# Patient Record
Sex: Male | Born: 1935 | Race: White | Hispanic: No | Marital: Married | Smoking: Former smoker
Health system: Southern US, Community
[De-identification: ages and names within clinical notes are randomized; demographics above are authoritative.]

## PROBLEM LIST (undated history)

## (undated) DIAGNOSIS — I10 Essential (primary) hypertension: Secondary | ICD-10-CM

## (undated) DIAGNOSIS — M1990 Unspecified osteoarthritis, unspecified site: Secondary | ICD-10-CM

## (undated) DIAGNOSIS — E785 Hyperlipidemia, unspecified: Secondary | ICD-10-CM

## (undated) DIAGNOSIS — K859 Acute pancreatitis without necrosis or infection, unspecified: Secondary | ICD-10-CM

## (undated) HISTORY — PX: CHOLECYSTECTOMY: SHX55

---

## 1999-06-02 ENCOUNTER — Inpatient Hospital Stay (HOSPITAL_COMMUNITY): Admission: RE | Admit: 1999-06-02 | Discharge: 1999-06-04 | Payer: Self-pay | Admitting: Urology

## 2001-01-23 HISTORY — PX: OTHER SURGICAL HISTORY: SHX169

## 2011-08-28 ENCOUNTER — Encounter (HOSPITAL_COMMUNITY): Payer: Self-pay | Admitting: *Deleted

## 2011-08-28 NOTE — Progress Notes (Signed)
I called and spoke with Gwyn at Dr [facility] office and requested orders.

## 2011-08-28 NOTE — H&P (Signed)
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 Pt has done very well since. Several months ago he presented to [facility] due to groin pain. Workup showed some arthritis but a retained TFN in the R hip. Pt was referred to OTS for HW removal to see if this is is source of pain or if he indeed has symptomatic R hip arthritis.  Pt with no real complaints today No numbness or tingling No CP, No SOB   Past Medical History  Diagnosis Date  . Diabetes mellitus   . Hyperlipemia   . Hypertension   . Pancreatitis     due to gall stone  . Arthritis     Past Surgical History  Procedure Date  . Right leg fracture 2003    repair with rod-  [facility]  . Cholecystectomy     Family History  Problem Relation Age of Onset  . Diabetes     Social History:  reports that he quit smoking about 27 years ago. He does not have any smokeless tobacco history on file. He reports that he does not drink alcohol or use illicit drugs.  Allergies: No Known Allergies  No prescriptions prior to admission    No results found for this or any previous visit (from the past 48 hour(s)). No results found.  Review of Systems  Musculoskeletal: Positive for joint pain.       R hip pain  All other systems reviewed and are negative.    Height 5\' 8"  (1.727 m), weight 77.111 kg (170 lb). Physical Exam  Constitutional: Vital signs are normal. He appears well-developed and well-nourished. He is cooperative.  HENT:  Head: Normocephalic and atraumatic.  Mouth/Throat: He has dentures.  Eyes: EOM are normal.  Neck: Normal range of motion. Neck supple. No spinous process tenderness and no muscular tenderness present. Normal range of motion present.  Cardiovascular: Normal rate, regular rhythm, S1 normal and S2 normal.   Pulses:      Dorsalis pedis pulses are 2+ on the  right side, and 2+ on the left side.  Respiratory: Effort normal. No respiratory distress. He has no wheezes. He has no rhonchi. He has no rales.  GI: Soft. Normal appearance. There is no tenderness.       + bowel sounds  Musculoskeletal:       Right Lower Extremity   Old wounds stable     Distal motor and sensory functions intact   + groin pain with axial load   Limited IR and ER, pain with these   Good hip flexion    Knee ROM is excellent   Ext is warm   + DP pulse    No swelling of significance     Neurological: He is alert.  Psychiatric: He has a normal mood and affect. His speech is normal and behavior is normal. Judgment and thought content normal. Cognition and memory are normal.     Xrays reviewed in office.   Assessment/Plan  78 y/o male with retained HW R hip  1. Retained HW R hip due to previous fx  OR for removal and allografting of defect  No restrictions after removal  OVN stay, d/c in am  2. Medical issues  Monitor  Home meds 3. DVT/PE prophylaxis  No pharmacologic anticoagulation anticipated 4. Dispo  OR  Mearl Latin, PA-C [facility] [phone] (P) 08/28/2011, 3:04 PM

## 2011-08-28 NOTE — Progress Notes (Signed)
PCP is Dr Windle Guard , pt said that he had labs drawn there  1 week ago.  I requested labs from Dr Shelah Lewandowsky. Pt states he has never had a 2D echo or stress test.

## 2011-08-29 ENCOUNTER — Ambulatory Visit (HOSPITAL_COMMUNITY): Payer: Medicare Other

## 2011-08-29 ENCOUNTER — Ambulatory Visit (HOSPITAL_COMMUNITY): Payer: Medicare Other | Admitting: Anesthesiology

## 2011-08-29 ENCOUNTER — Encounter (HOSPITAL_COMMUNITY): Payer: Self-pay | Admitting: Anesthesiology

## 2011-08-29 ENCOUNTER — Encounter (HOSPITAL_COMMUNITY): Payer: Self-pay | Admitting: *Deleted

## 2011-08-29 ENCOUNTER — Encounter (HOSPITAL_COMMUNITY)
Admission: RE | Disposition: A | Discharge status: Home / Self Care | Payer: Self-pay | Source: Ambulatory Visit | Attending: Orthopedic Surgery

## 2011-08-29 ENCOUNTER — Ambulatory Visit (HOSPITAL_COMMUNITY)
Admission: RE | Admit: 2011-08-29 | Discharge: 2011-08-30 | Disposition: A | Discharge status: Home / Self Care | Payer: Medicare Other | Source: Ambulatory Visit | Attending: Orthopedic Surgery | Admitting: Orthopedic Surgery

## 2011-08-29 DIAGNOSIS — I10 Essential (primary) hypertension: Secondary | ICD-10-CM

## 2011-08-29 DIAGNOSIS — M129 Arthropathy, unspecified: Secondary | ICD-10-CM | POA: Insufficient documentation

## 2011-08-29 DIAGNOSIS — E119 Type 2 diabetes mellitus without complications: Secondary | ICD-10-CM

## 2011-08-29 DIAGNOSIS — T85848A Pain due to other internal prosthetic devices, implants and grafts, initial encounter: Secondary | ICD-10-CM

## 2011-08-29 DIAGNOSIS — E785 Hyperlipidemia, unspecified: Secondary | ICD-10-CM

## 2011-08-29 DIAGNOSIS — T84498A Other mechanical complication of other internal orthopedic devices, implants and grafts, initial encounter: Secondary | ICD-10-CM | POA: Insufficient documentation

## 2011-08-29 DIAGNOSIS — Y834 Other reconstructive surgery as the cause of abnormal reaction of the patient, or of later complication, without mention of misadventure at the time of the procedure: Secondary | ICD-10-CM | POA: Insufficient documentation

## 2011-08-29 HISTORY — DX: Acute pancreatitis without necrosis or infection, unspecified: K85.90

## 2011-08-29 HISTORY — DX: Unspecified osteoarthritis, unspecified site: M19.90

## 2011-08-29 HISTORY — DX: Essential (primary) hypertension: I10

## 2011-08-29 HISTORY — DX: Hyperlipidemia, unspecified: E78.5

## 2011-08-29 HISTORY — PX: HARDWARE REMOVAL: SHX979

## 2011-08-29 LAB — COMPREHENSIVE METABOLIC PANEL
AST: 32 U/L (ref 0–37)
Albumin: 3.8 g/dL (ref 3.5–5.2)
BUN: 18 mg/dL (ref 6–23)
Calcium: 9.3 mg/dL (ref 8.4–10.5)
Creatinine, Ser: 0.83 mg/dL (ref 0.50–1.35)
GFR calc non Af Amer: 80 mL/min — ABNORMAL LOW (ref >90)

## 2011-08-29 LAB — PROTIME-INR
INR: 0.93 (ref 0.00–1.49)
Prothrombin Time: 12.7 seconds (ref 11.6–15.2)

## 2011-08-29 LAB — CBC
HCT: 45 % (ref 39.0–52.0)
MCH: 30.1 pg (ref 26.0–34.0)
MCV: 90.2 fL (ref 78.0–100.0)
Platelets: 244 10*3/uL (ref 150–400)
RDW: 13 % (ref 11.5–15.5)

## 2011-08-29 LAB — APTT: aPTT: 29 seconds (ref 24–37)

## 2011-08-29 LAB — GLUCOSE, CAPILLARY
Glucose-Capillary: 166 mg/dL — ABNORMAL HIGH (ref 70–99)
Glucose-Capillary: 152 mg/dL — ABNORMAL HIGH (ref 70–99)
Glucose-Capillary: 257 mg/dL — ABNORMAL HIGH (ref 70–99)

## 2011-08-29 LAB — SURGICAL PCR SCREEN: Staphylococcus aureus: NEGATIVE

## 2011-08-29 SURGERY — REMOVAL, HARDWARE
Anesthesia: General | Site: Hip | Laterality: Right | Wound class: Clean

## 2011-08-29 MED ORDER — ASPIRIN BUFFERED 325 MG PO TABS: 325.0000 mg | ORAL_TABLET | Freq: Two times a day (BID) | ORAL | Status: DC

## 2011-08-29 MED ORDER — HYDROMORPHONE HCL PF 1 MG/ML IJ SOLN
INTRAMUSCULAR | Status: AC
  Filled 2011-08-29: qty 1

## 2011-08-29 MED ORDER — HYDROMORPHONE HCL PF 1 MG/ML IJ SOLN
0.2500 mg | INTRAMUSCULAR | Status: DC | PRN
  Administered 2011-08-29 (×2): 0.5 mg via INTRAVENOUS

## 2011-08-29 MED ORDER — METHOCARBAMOL 100 MG/ML IJ SOLN
500.0000 mg | Freq: Four times a day (QID) | INTRAVENOUS | Status: DC | PRN
  Filled 2011-08-29: qty 5

## 2011-08-29 MED ORDER — METOCLOPRAMIDE HCL 5 MG/ML IJ SOLN: 5.0000 mg | Freq: Three times a day (TID) | INTRAMUSCULAR | Status: DC | PRN

## 2011-08-29 MED ORDER — 0.9 % SODIUM CHLORIDE (POUR BTL) OPTIME
TOPICAL | Status: DC | PRN
  Administered 2011-08-29: 1000 mL

## 2011-08-29 MED ORDER — EPHEDRINE SULFATE 50 MG/ML IJ SOLN
INTRAMUSCULAR | Status: DC | PRN
  Administered 2011-08-29 (×4): 10 mg via INTRAVENOUS

## 2011-08-29 MED ORDER — LACTATED RINGERS IV SOLN
INTRAVENOUS | Status: DC | PRN
  Administered 2011-08-29 (×2): via INTRAVENOUS

## 2011-08-29 MED ORDER — ONDANSETRON HCL 4 MG PO TABS: 4.0000 mg | ORAL_TABLET | Freq: Four times a day (QID) | ORAL | Status: DC | PRN

## 2011-08-29 MED ORDER — CHLORHEXIDINE GLUCONATE 4 % EX LIQD: 60.0000 mL | Freq: Once | CUTANEOUS | Status: DC

## 2011-08-29 MED ORDER — METOCLOPRAMIDE HCL 10 MG PO TABS: 5.0000 mg | ORAL_TABLET | Freq: Three times a day (TID) | ORAL | Status: DC | PRN

## 2011-08-29 MED ORDER — ONDANSETRON HCL 4 MG/2ML IJ SOLN: 4.0000 mg | Freq: Four times a day (QID) | INTRAMUSCULAR | Status: DC | PRN

## 2011-08-29 MED ORDER — KETOROLAC TROMETHAMINE 30 MG/ML IJ SOLN
30.0000 mg | Freq: Four times a day (QID) | INTRAMUSCULAR | Status: DC
  Administered 2011-08-29 – 2011-08-30 (×3): 30 mg via INTRAVENOUS
  Filled 2011-08-29 (×7): qty 1

## 2011-08-29 MED ORDER — METHOCARBAMOL 500 MG PO TABS: 500.0000 mg | ORAL_TABLET | Freq: Four times a day (QID) | ORAL | Status: DC | PRN

## 2011-08-29 MED ORDER — CEFAZOLIN SODIUM-DEXTROSE 2-3 GM-% IV SOLR
INTRAVENOUS | Status: AC
  Filled 2011-08-29: qty 50

## 2011-08-29 MED ORDER — DOCUSATE SODIUM 100 MG PO CAPS
100.0000 mg | ORAL_CAPSULE | Freq: Two times a day (BID) | ORAL | Status: DC
  Administered 2011-08-29: 100 mg via ORAL
  Filled 2011-08-29 (×3): qty 1

## 2011-08-29 MED ORDER — INSULIN ASPART 100 UNIT/ML ~~LOC~~ SOLN
0.0000 [IU] | Freq: Three times a day (TID) | SUBCUTANEOUS | Status: DC
  Administered 2011-08-30: 2 [IU] via SUBCUTANEOUS

## 2011-08-29 MED ORDER — MUPIROCIN 2 % EX OINT
TOPICAL_OINTMENT | CUTANEOUS | Status: AC
  Administered 2011-08-29: 1 via NASAL
  Filled 2011-08-29: qty 22

## 2011-08-29 MED ORDER — ACETAMINOPHEN 10 MG/ML IV SOLN
1000.0000 mg | Freq: Four times a day (QID) | INTRAVENOUS | Status: DC
  Administered 2011-08-29 – 2011-08-30 (×3): 1000 mg via INTRAVENOUS
  Filled 2011-08-29 (×4): qty 100

## 2011-08-29 MED ORDER — POTASSIUM CHLORIDE IN NACL 20-0.9 MEQ/L-% IV SOLN
INTRAVENOUS | Status: DC
  Filled 2011-08-29 (×2): qty 1000

## 2011-08-29 MED ORDER — CEFAZOLIN SODIUM-DEXTROSE 2-3 GM-% IV SOLR
2.0000 g | INTRAVENOUS | Status: AC
  Administered 2011-08-29: 2 g via INTRAVENOUS

## 2011-08-29 MED ORDER — BUPIVACAINE-EPINEPHRINE PF 0.25-1:200000 % IJ SOLN
INTRAMUSCULAR | Status: DC | PRN
  Administered 2011-08-29: 10 mL

## 2011-08-29 MED ORDER — MUPIROCIN 2 % EX OINT
TOPICAL_OINTMENT | Freq: Two times a day (BID) | CUTANEOUS | Status: DC
  Filled 2011-08-29 (×2): qty 22

## 2011-08-29 MED ORDER — LACTATED RINGERS IV SOLN
INTRAVENOUS | Status: DC
  Administered 2011-08-29: 09:00:00 via INTRAVENOUS

## 2011-08-29 MED ORDER — OXYCODONE HCL 5 MG PO TABS
5.0000 mg | ORAL_TABLET | ORAL | Status: DC | PRN
  Administered 2011-08-30: 5 mg via ORAL
  Filled 2011-08-29: qty 2

## 2011-08-29 MED ORDER — FENTANYL CITRATE 0.05 MG/ML IJ SOLN
INTRAMUSCULAR | Status: DC | PRN
  Administered 2011-08-29 (×2): 50 ug via INTRAVENOUS
  Administered 2011-08-29: 100 ug via INTRAVENOUS

## 2011-08-29 MED ORDER — GEMFIBROZIL 600 MG PO TABS
600.0000 mg | ORAL_TABLET | Freq: Two times a day (BID) | ORAL | Status: DC
Start: 2011-08-29 — End: 2011-08-30
  Administered 2011-08-29 – 2011-08-30 (×2): 600 mg via ORAL
  Filled 2011-08-29 (×3): qty 1

## 2011-08-29 MED ORDER — CEFAZOLIN SODIUM-DEXTROSE 2-3 GM-% IV SOLR
2.0000 g | Freq: Three times a day (TID) | INTRAVENOUS | Status: AC
  Administered 2011-08-29 – 2011-08-30 (×3): 2 g via INTRAVENOUS
  Filled 2011-08-29 (×3): qty 50

## 2011-08-29 MED ORDER — BUPIVACAINE-EPINEPHRINE PF 0.25-1:200000 % IJ SOLN
INTRAMUSCULAR | Status: AC
  Filled 2011-08-29: qty 30

## 2011-08-29 MED ORDER — ASPIRIN 325 MG PO TABS
325.0000 mg | ORAL_TABLET | Freq: Two times a day (BID) | ORAL | Status: DC
  Administered 2011-08-29 – 2011-08-30 (×2): 325 mg via ORAL
  Filled 2011-08-29 (×3): qty 1

## 2011-08-29 MED ORDER — PHENYLEPHRINE HCL 10 MG/ML IJ SOLN
INTRAMUSCULAR | Status: DC | PRN
  Administered 2011-08-29: 80 ug via INTRAVENOUS
  Administered 2011-08-29: 40 ug via INTRAVENOUS
  Administered 2011-08-29: 80 ug via INTRAVENOUS
  Administered 2011-08-29: 40 ug via INTRAVENOUS
  Administered 2011-08-29: 80 ug via INTRAVENOUS
  Administered 2011-08-29 (×2): 40 ug via INTRAVENOUS

## 2011-08-29 MED ORDER — MORPHINE SULFATE 2 MG/ML IJ SOLN: 1.0000 mg | INTRAMUSCULAR | Status: DC | PRN

## 2011-08-29 MED ORDER — PROPOFOL 10 MG/ML IV EMUL
INTRAVENOUS | Status: DC | PRN
  Administered 2011-08-29: 150 mg via INTRAVENOUS

## 2011-08-29 SURGICAL SUPPLY — 64 items
BANDAGE ELASTIC 4 VELCRO ST LF (GAUZE/BANDAGES/DRESSINGS) IMPLANT
BANDAGE ELASTIC 6 VELCRO ST LF (GAUZE/BANDAGES/DRESSINGS) IMPLANT
BANDAGE ESMARK 6X9 LF (GAUZE/BANDAGES/DRESSINGS) ×1 IMPLANT
BANDAGE GAUZE ELAST BULKY 4 IN (GAUZE/BANDAGES/DRESSINGS) IMPLANT
BNDG CMPR 9X6 STRL LF SNTH (GAUZE/BANDAGES/DRESSINGS)
BNDG COHESIVE 6X5 TAN STRL LF (GAUZE/BANDAGES/DRESSINGS) ×2 IMPLANT
BNDG ESMARK 6X9 LF (GAUZE/BANDAGES/DRESSINGS)
BONE CHIP PRESERV 20CC (Bone Implant) ×1 IMPLANT
BRUSH SCRUB DISP (MISCELLANEOUS) ×4 IMPLANT
CLEANER TIP ELECTROSURG 2X2 (MISCELLANEOUS) ×1 IMPLANT
CLOTH BEACON ORANGE TIMEOUT ST (SAFETY) ×2 IMPLANT
COVER SURGICAL LIGHT HANDLE (MISCELLANEOUS) ×4 IMPLANT
CUFF TOURNIQUET SINGLE 18IN (TOURNIQUET CUFF) IMPLANT
CUFF TOURNIQUET SINGLE 24IN (TOURNIQUET CUFF) IMPLANT
CUFF TOURNIQUET SINGLE 34IN LL (TOURNIQUET CUFF) IMPLANT
DRAPE C-ARM 42X72 X-RAY (DRAPES) IMPLANT
DRAPE C-ARMOR (DRAPES) ×1 IMPLANT
DRAPE OEC MINIVIEW 54X84 (DRAPES) ×1 IMPLANT
DRAPE U-SHAPE 47X51 STRL (DRAPES) ×2 IMPLANT
DRSG ADAPTIC 3X8 NADH LF (GAUZE/BANDAGES/DRESSINGS) ×1 IMPLANT
DRSG MEPILEX BORDER 4X4 (GAUZE/BANDAGES/DRESSINGS) ×2 IMPLANT
DRSG MEPILEX BORDER 4X8 (GAUZE/BANDAGES/DRESSINGS) ×1 IMPLANT
ELECT REM PT RETURN 9FT ADLT (ELECTROSURGICAL)
ELECTRODE REM PT RTRN 9FT ADLT (ELECTROSURGICAL) ×1 IMPLANT
EVACUATOR 1/8 PVC DRAIN (DRAIN) IMPLANT
GLOVE BIO SURGEON STRL SZ7.5 (GLOVE) ×2 IMPLANT
GLOVE BIO SURGEON STRL SZ8 (GLOVE) ×2 IMPLANT
GLOVE BIOGEL PI IND STRL 7.5 (GLOVE) ×1 IMPLANT
GLOVE BIOGEL PI IND STRL 8 (GLOVE) ×1 IMPLANT
GLOVE BIOGEL PI INDICATOR 7.5 (GLOVE) ×1
GLOVE BIOGEL PI INDICATOR 8 (GLOVE) ×1
GOWN PREVENTION PLUS XLARGE (GOWN DISPOSABLE) ×2 IMPLANT
GOWN STRL NON-REIN LRG LVL3 (GOWN DISPOSABLE) ×4 IMPLANT
GUIDEWIRE 3.2X400 (WIRE) ×1 IMPLANT
KIT BASIN OR (CUSTOM PROCEDURE TRAY) ×2 IMPLANT
KIT ROOM TURNOVER OR (KITS) ×2 IMPLANT
MANIFOLD NEPTUNE II (INSTRUMENTS) ×2 IMPLANT
NEEDLE 22X1 1/2 (OR ONLY) (NEEDLE) IMPLANT
NS IRRIG 1000ML POUR BTL (IV SOLUTION) ×2 IMPLANT
PACK GENERAL/GYN (CUSTOM PROCEDURE TRAY) ×1 IMPLANT
PACK ORTHO EXTREMITY (CUSTOM PROCEDURE TRAY) ×1 IMPLANT
PAD ARMBOARD 7.5X6 YLW CONV (MISCELLANEOUS) ×4 IMPLANT
PADDING CAST COTTON 6X4 STRL (CAST SUPPLIES) ×3 IMPLANT
PENCIL BUTTON BLDE SNGL 10FT (ELECTRODE) ×1 IMPLANT
SPONGE GAUZE 4X4 12PLY (GAUZE/BANDAGES/DRESSINGS) ×2 IMPLANT
SPONGE LAP 18X18 X RAY DECT (DISPOSABLE) ×1 IMPLANT
SPONGE SCRUB IODOPHOR (GAUZE/BANDAGES/DRESSINGS) ×2 IMPLANT
STAPLER VISISTAT 35W (STAPLE) ×1 IMPLANT
STOCKINETTE IMPERVIOUS LG (DRAPES) ×2 IMPLANT
STRIP CLOSURE SKIN 1/2X4 (GAUZE/BANDAGES/DRESSINGS) IMPLANT
SUCTION FRAZIER TIP 10 FR DISP (SUCTIONS) IMPLANT
SUT ETHILON 3 0 PS 1 (SUTURE) IMPLANT
SUT PDS AB 2-0 CT1 27 (SUTURE) IMPLANT
SUT VIC AB 0 CT1 27 (SUTURE) ×2
SUT VIC AB 0 CT1 27XBRD ANBCTR (SUTURE) IMPLANT
SUT VIC AB 2-0 CT1 27 (SUTURE) ×2
SUT VIC AB 2-0 CT1 TAPERPNT 27 (SUTURE) IMPLANT
SYR CONTROL 10ML LL (SYRINGE) ×1 IMPLANT
TOWEL OR 17X24 6PK STRL BLUE (TOWEL DISPOSABLE) ×4 IMPLANT
TOWEL OR 17X26 10 PK STRL BLUE (TOWEL DISPOSABLE) ×4 IMPLANT
TUBE CONNECTING 12X1/4 (SUCTIONS) ×1 IMPLANT
UNDERPAD 30X30 INCONTINENT (UNDERPADS AND DIAPERS) ×2 IMPLANT
WATER STERILE IRR 1000ML POUR (IV SOLUTION) ×4 IMPLANT
YANKAUER SUCT BULB TIP NO VENT (SUCTIONS) ×1 IMPLANT

## 2011-08-29 NOTE — Progress Notes (Signed)
Feels sensastion and touch moves toes without difficulty pulses doppled

## 2011-08-29 NOTE — Anesthesia Preprocedure Evaluation (Signed)
Anesthesia Evaluation  Patient identified by MRN, date of birth, ID band Patient awake    Reviewed: Allergy & Precautions, H&P , NPO status , Patient's Chart, lab work & pertinent test results  Airway Mallampati: II      Dental   Pulmonary neg pulmonary ROS,  breath sounds clear to auscultation        Cardiovascular hypertension, Pt. on medications Rhythm:Regular Rate:Normal     Neuro/Psych negative neurological ROS     GI/Hepatic negative GI ROS,   Endo/Other  Type 2  Renal/GU negative Renal ROS     Musculoskeletal   Abdominal   Peds  Hematology   Anesthesia Other Findings   Reproductive/Obstetrics                           Anesthesia Physical Anesthesia Plan  ASA: III  Anesthesia Plan: General   Post-op Pain Management:    Induction: Intravenous  Airway Management Planned: Oral ETT  Additional Equipment:   Intra-op Plan:   Post-operative Plan:   Informed Consent:   Plan Discussed with: CRNA and Anesthesiologist  Anesthesia Plan Comments:         Anesthesia Quick Evaluation

## 2011-08-29 NOTE — Brief Op Note (Signed)
08/29/2011  2:00 PM  PATIENT:  Jerome Sweeney  78 y.o. male  PRE-OPERATIVE DIAGNOSIS:  SYSTEMATIC HARDWARE RIGHT HIP  POST-OPERATIVE DIAGNOSIS:  SYSTEMATIC HARDWARE RIGHT HIP  PROCEDURE:  Procedure(s) (LRB): HARDWARE REMOVAL (Right) and bone grafting of femoral head, neck, proximal femur  SURGEON:  Surgeon(s) and Role:    * Budd Palmer, MD - Primary  ASSISTANTS: student  ANESTHESIA:   general  EBL:  Total I/O In: 1000 [I.V.:1000] Out: -   BLOOD ADMINISTERED:none  DRAINS: none   LOCAL MEDICATIONS USED:  MARCAINE     SPECIMEN:  No Specimen  DISPOSITION OF SPECIMEN:  N/A  COUNTS:  YES  TOURNIQUET:  * No tourniquets in log *  DICTATION: .Other Dictation: Dictation Number [id number]  PLAN OF CARE: Admit for overnight observation  PATIENT DISPOSITION:  PACU - hemodynamically stable.   Delay start of Pharmacological VTE agent (>24hrs) due to surgical blood loss or risk of bleeding: no

## 2011-08-29 NOTE — Preoperative (Signed)
Beta Blockers   Reason not to administer Beta Blockers:Not Applicable 

## 2011-08-29 NOTE — Progress Notes (Signed)
Spoke with dr Randa Evens and informed her pt drank 1/4 cup sunny D with am meds

## 2011-08-29 NOTE — Anesthesia Postprocedure Evaluation (Signed)
Anesthesia Post Note  Patient: Jerome Sweeney  Procedure(s) Performed: Procedure(s) (LRB): HARDWARE REMOVAL (Right)  Anesthesia type: General  Patient location: PACU  Post pain: Pain level controlled  Post assessment: Post-op Vital signs reviewed  Last Vitals: BP 129/70  Pulse 103  Temp 36.3 C (Oral)  Resp 11  Ht 5\' 8"  (1.727 m)  Wt 170 lb (77.111 kg)  BMI 25.85 kg/m2  SpO2 93%  Post vital signs: Reviewed  Level of consciousness: sedated  Complications: No apparent anesthesia complications

## 2011-08-29 NOTE — Transfer of Care (Signed)
Immediate Anesthesia Transfer of Care Note  Patient: Jerome Sweeney  Procedure(s) Performed: Procedure(s) (LRB): HARDWARE REMOVAL (Right)  Patient Location: PACU  Anesthesia Type: General  Level of Consciousness: awake, alert , oriented and sedated  Airway & Oxygen Therapy: Patient Spontanous Breathing and Patient connected to face mask oxygen  Post-op Assessment: Report given to PACU RN, Post -op Vital signs reviewed and stable and Patient moving all extremities  Post vital signs: Reviewed and stable  Complications: No apparent anesthesia complications

## 2011-08-29 NOTE — H&P (Signed)
I have seen and examined the patient. I agree with the findings above. I discussed with the patient the risks and benefits of surgery, including the possibility of infection, nerve injury, vessel injury, wound breakdown, arthritis, failure to alleviate all symptoms, DVT/ PE, loss of motion, and need for further surgery among others.   He understood these risks and wished to proceed.   Budd Palmer, MD 08/29/2011 3:40 PM

## 2011-08-30 ENCOUNTER — Encounter (HOSPITAL_COMMUNITY): Payer: Self-pay | Admitting: Orthopedic Surgery

## 2011-08-30 DIAGNOSIS — E785 Hyperlipidemia, unspecified: Secondary | ICD-10-CM

## 2011-08-30 DIAGNOSIS — T85848A Pain due to other internal prosthetic devices, implants and grafts, initial encounter: Secondary | ICD-10-CM

## 2011-08-30 DIAGNOSIS — E119 Type 2 diabetes mellitus without complications: Secondary | ICD-10-CM

## 2011-08-30 DIAGNOSIS — I10 Essential (primary) hypertension: Secondary | ICD-10-CM

## 2011-08-30 LAB — HEMOGLOBIN A1C: Mean Plasma Glucose: 154 mg/dL — ABNORMAL HIGH (ref <117)

## 2011-08-30 LAB — GLUCOSE, CAPILLARY: Glucose-Capillary: 143 mg/dL — ABNORMAL HIGH (ref 70–99)

## 2011-08-30 NOTE — Op Note (Signed)
NAME:  Jerome Sweeney                ACCOUNT NO.:  [id number]  MEDICAL RECORD NO.:  [id number]  LOCATION:  [facility]                         FACILITY:  [facility]  PHYSICIAN:  Doralee Albino. Carola Frost, M.D. DATE OF BIRTH:  [birth year]  DATE OF PROCEDURE:  08/29/2011 DATE OF DISCHARGE:                              OPERATIVE REPORT   PREOPERATIVE DIAGNOSES:  Symptomatic right hip hardware, status post repair of proximal and mid shaft femur fractures.  POSTOPERATIVE DIAGNOSES:  Symptomatic right hip hardware, status post repair of proximal and mid shaft femur fractures.  PROCEDURE:  Removal of right femoral nail with bone grafting of the femoral head, neck, and proximal femur.  SURGEON:  Doralee Albino. Carola Frost, M.D.  ASSISTANT:  Student.  ANESTHESIA:  General.  COMPLICATIONS:  None.  SPECIMENS:  None.  DRAINS:  None.  DISPOSITION:  To PACU.  CONDITION:  Stable.  BRIEF SUMMARY OF INDICATION FOR PROCEDURE:  Jerome Sweeney is a very pleasant 78 year old male, status post repair of a right segmental femur fracture.  The patient went on to unite with questionable settling and penetration of the femoral head by the lag screw.  He saw Dr. Chryl Heck in [place] for consideration of possible total hip arthroplasty and was informed at that time, that he should first proceed with removal of the implants to see if his symptoms would improve, followed by total hip arthroplasty if indications still are present.  The patient understood the risk of today's procedure to include failure to alleviate his symptoms and infection, nerve injury, vessel injury, DVT, PE, heart attack, stroke, need for further surgery, or re-fracture.  BRIEF SUMMARY OF PROCEDURE:  The patient was taken to the operating room where general anesthesia was induced. He did receive preoperative antibiotics.  His right lower extremity was prepped and draped in sterile fashion.  All the previous incisions used to place the implants were remade with  dissection carried carefully down to the implant tips in order to avoid any additional stripping.  Proximally, there was complete overgrowth of the tip of the nail and a curette was used to identify the canal as well as with sequential curettage to debride the adherent material until the threads of the extraction device could be engaged.  This was performed in similar fashion to the lag screw into the femoral head and distally, the locking bolts were removed through stab incisions.  Eventually, we were able to remove all of the implants, bone was irrigated, there did not appear to be any evidence of a persistent nonunion.  A 40 mL of cancellous graft were placed using a syringe impactor with seeding in some of the graft up into the femoral head, femoral neck and proximal femur.  Wounds were all irrigated thoroughly, closed in standard layered fashion using 0 Vicryl, 2-0 Vicryl, and staples for the skin.  Marcaine with epinephrine was used to close to the incision for additional pain control.  Sterile gently compressive dressings were applied.  The patient was then taken to PACU in stable condition.  PROGNOSIS:  [name] will be weightbearing as tolerated with no motion restrictions.  We anticipate an overnight stay given his limited social support, with a discharge in  the morning, and follow up at the office in 10-14 days for removal of sutures.  It did appear intraoperatively that the fractured settle such that the tip of the lag screw was possibly penetrating the surface.     Doralee Albino. Carola Frost, M.D.     [facility]/MEDQ  D:  08/29/2011  T:  08/30/2011  Job:  [id number]
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
## 2011-08-30 NOTE — Progress Notes (Signed)
Orthopaedic Trauma Service (OTS)  Subjective: 1 Day Post-Op Procedure(s) (LRB): HARDWARE REMOVAL (Right)    Doing well  Ready to go home Tolerated diet No issues noted  Objective: Current Vitals Blood pressure 126/74, pulse 81, temperature 98.4 F (36.9 C), temperature source Oral, resp. rate 16, height 5\' 8"  (1.727 m), weight 77.111 kg (170 lb), SpO2 97.00%. Vital signs in last 24 hours: Temp:  [97.3 F (36.3 C)-98.6 F (37 C)] 98.4 F (36.9 C) (08/07 0443) Pulse Rate:  [81-114] 81  (08/07 0443) Resp:  [11-20] 16  (08/07 0443) BP: (110-145)/(54-80) 126/74 mmHg (08/07 0443) SpO2:  [90 %-97 %] 97 % (08/07 0443)  Intake/Output from previous day: 08/06 0701 - 08/07 0700 In: 2545 [P.O.:520; I.V.:2000] Out: 150 [Urine:150] Intake/Output      08/06 0701 - 08/07 0700 08/07 0701 - 08/08 0700   P.O. 520    I.V. (mL/kg) 2000 (25.9)    Other 25    Total Intake(mL/kg) 2545 (33)    Urine (mL/kg/hr) 150 (0.1)    Total Output 150    Net +2395            LABS  Basename 08/29/11 0707  HGB 15.0    Basename 08/29/11 0707  WBC 5.2  RBC 4.99  HCT 45.0  PLT 244    Basename 08/29/11 0707  NA 139  K 3.9  CL 103  CO2 24  BUN 18  CREATININE 0.83  GLUCOSE 170*  CALCIUM 9.3    Basename 08/29/11 0707  LABPT --  INR 0.93    Physical Exam  Gen: NAD, sitting on EOB, walked in room Lungs:breathing unlabored Ext: Right Leg  Exam unremarkable  Dressings stable    Assessment/Plan: 1 Day Post-Op Procedure(s) (LRB): HARDWARE REMOVAL (Right)  78 y/o male s/p hardware removal R hip  1. Symptomatic HW R hip pod #1   NO restrictions  WBAT  ROM as tolerated  F/u 10 days  2. Medical issues  DM- restart home meds at d/c  HTN- restart home meds at d/c  Hyperlipidemia- on home meds  3. DVT/PE prophylaxis  Pt on ASA at home, continue 4. FEN  Diet as tolerated  D/c iv and IVF 5. Pain  Tylenol, norco 6. dispo  D/c home today   Mearl Latin,  PA-C [facility] [phone] (P) 08/30/2011, 8:50 AM

## 2011-09-12 NOTE — Progress Notes (Signed)
I have seen and examined the patient. I agree with the findings above.  Hanae Waiters H, MD  

## 2012-09-02 ENCOUNTER — Other Ambulatory Visit: Payer: Self-pay | Admitting: Oncology

## 2012-09-02 DIAGNOSIS — R160 Hepatomegaly, not elsewhere classified: Secondary | ICD-10-CM

## 2012-09-10 ENCOUNTER — Ambulatory Visit
Admission: RE | Admit: 2012-09-10 | Discharge: 2012-09-10 | Disposition: A | Discharge status: Home / Self Care | Payer: Medicare Other | Source: Ambulatory Visit | Attending: Oncology | Admitting: Oncology

## 2012-09-10 DIAGNOSIS — R160 Hepatomegaly, not elsewhere classified: Secondary | ICD-10-CM

## 2012-09-10 HISTORY — DX: Hyperlipidemia, unspecified: E78.5

## 2012-09-13 ENCOUNTER — Telehealth: Payer: Self-pay | Admitting: Emergency Medicine

## 2012-09-13 NOTE — Telephone Encounter (Signed)
CALLED PT TO MAKE HIM AWARE THAT INS. HAS APPROVED HIS Y-90 PROCEDURE AND HE SHOULD EXPECT A CALL FROM TINA AT [facility] TO SET UP THE DATES.   HE WAS COMPLAINING ABOUT HIS HERNIA AND I TOLD HIM TO CALL DR MCCARTY OFFICE TO HAVE THAT ADDRESSED AND TO REFER HIM TO ANOTHER DR. TO BE EVALUATED.

## 2012-09-18 ENCOUNTER — Other Ambulatory Visit (HOSPITAL_COMMUNITY): Payer: Self-pay | Admitting: Interventional Radiology

## 2012-09-18 DIAGNOSIS — C220 Liver cell carcinoma: Secondary | ICD-10-CM

## 2012-09-19 ENCOUNTER — Other Ambulatory Visit: Payer: Self-pay | Admitting: Radiology

## 2012-09-20 ENCOUNTER — Encounter (HOSPITAL_COMMUNITY): Payer: Self-pay | Admitting: Pharmacy Technician

## 2012-09-24 ENCOUNTER — Encounter (HOSPITAL_COMMUNITY): Payer: Self-pay

## 2012-09-24 ENCOUNTER — Encounter (HOSPITAL_COMMUNITY)
Admission: RE | Admit: 2012-09-24 | Discharge: 2012-09-24 | Disposition: A | Discharge status: Home / Self Care | Payer: Medicare Other | Source: Ambulatory Visit | Attending: Interventional Radiology | Admitting: Interventional Radiology

## 2012-09-24 ENCOUNTER — Ambulatory Visit (HOSPITAL_COMMUNITY)
Admission: RE | Admit: 2012-09-24 | Discharge: 2012-09-24 | Disposition: A | Discharge status: Home / Self Care | Payer: Medicare Other | Source: Ambulatory Visit | Attending: Interventional Radiology | Admitting: Interventional Radiology

## 2012-09-24 ENCOUNTER — Other Ambulatory Visit (HOSPITAL_COMMUNITY): Payer: Self-pay | Admitting: Interventional Radiology

## 2012-09-24 DIAGNOSIS — E119 Type 2 diabetes mellitus without complications: Secondary | ICD-10-CM | POA: Insufficient documentation

## 2012-09-24 DIAGNOSIS — D490 Neoplasm of unspecified behavior of digestive system: Secondary | ICD-10-CM

## 2012-09-24 DIAGNOSIS — C220 Liver cell carcinoma: Secondary | ICD-10-CM

## 2012-09-24 DIAGNOSIS — I10 Essential (primary) hypertension: Secondary | ICD-10-CM | POA: Insufficient documentation

## 2012-09-24 DIAGNOSIS — C228 Malignant neoplasm of liver, primary, unspecified as to type: Secondary | ICD-10-CM | POA: Insufficient documentation

## 2012-09-24 DIAGNOSIS — E785 Hyperlipidemia, unspecified: Secondary | ICD-10-CM | POA: Insufficient documentation

## 2012-09-24 DIAGNOSIS — C801 Malignant (primary) neoplasm, unspecified: Secondary | ICD-10-CM

## 2012-09-24 LAB — PROTIME-INR
INR: 0.98 (ref 0.00–1.49)
Prothrombin Time: 12.8 seconds (ref 11.6–15.2)

## 2012-09-24 LAB — GLUCOSE, CAPILLARY: Glucose-Capillary: 125 mg/dL — ABNORMAL HIGH (ref 70–99)

## 2012-09-24 LAB — CBC
MCH: 28.9 pg (ref 26.0–34.0)
MCV: 89.1 fL (ref 78.0–100.0)
Platelets: 321 10*3/uL (ref 150–400)
RBC: 5.05 MIL/uL (ref 4.22–5.81)

## 2012-09-24 LAB — COMPREHENSIVE METABOLIC PANEL
AST: 266 U/L — ABNORMAL HIGH (ref 0–37)
BUN: 12 mg/dL (ref 6–23)
CO2: 27 mEq/L (ref 19–32)
Calcium: 8.3 mg/dL — ABNORMAL LOW (ref 8.4–10.5)
Creatinine, Ser: 0.64 mg/dL (ref 0.50–1.35)
GFR calc Af Amer: >90 mL/min (ref >90)
GFR calc non Af Amer: 89 mL/min — ABNORMAL LOW (ref >90)

## 2012-09-24 MED ORDER — MIDAZOLAM HCL 2 MG/2ML IJ SOLN
INTRAMUSCULAR | Status: AC | PRN
  Administered 2012-09-24 (×3): 1 mg via INTRAVENOUS

## 2012-09-24 MED ORDER — HYDROCODONE-ACETAMINOPHEN 5-325 MG PO TABS: 1.0000 | ORAL_TABLET | ORAL | Status: DC | PRN

## 2012-09-24 MED ORDER — TECHNETIUM TO 99M ALBUMIN AGGREGATED
4.9000 | Freq: Once | INTRAVENOUS | Status: AC | PRN
  Administered 2012-09-24: 5 via INTRAVENOUS

## 2012-09-24 MED ORDER — SODIUM CHLORIDE 0.9 % IV SOLN
INTRAVENOUS | Status: DC
  Administered 2012-09-24: 08:00:00 via INTRAVENOUS

## 2012-09-24 MED ORDER — FENTANYL CITRATE 0.05 MG/ML IJ SOLN
INTRAMUSCULAR | Status: AC | PRN
  Administered 2012-09-24 (×3): 50 ug via INTRAVENOUS

## 2012-09-24 MED ORDER — FENTANYL CITRATE 0.05 MG/ML IJ SOLN
INTRAMUSCULAR | Status: AC
  Filled 2012-09-24: qty 6

## 2012-09-24 MED ORDER — MIDAZOLAM HCL 2 MG/2ML IJ SOLN
INTRAMUSCULAR | Status: AC
  Filled 2012-09-24: qty 6

## 2012-09-24 MED ORDER — IOHEXOL 300 MG/ML  SOLN: 100.0000 mL | Freq: Once | INTRAMUSCULAR | Status: DC | PRN

## 2012-09-24 NOTE — Procedures (Signed)
Mesenteric arteriogram, GDA coil embolization, test MAA IA injection No complication No blood loss. See complete dictation in [facility].

## 2012-09-24 NOTE — H&P (Signed)
Chief Complaint: "I'm here for liver treatment" Referring Physician:Dr. Gilman Buttner HPI: Jerome Sweeney is an 79 y.o. male with hepatocellular carcinoma. He has seen Dr. Deanne Coffer in consult and is now scheduled for Pre Y-90 angiogram and eventual Y-90 radioembolization. He feels ok this am. He recalls most of what was discussed at his consultation. PMHx and meds reviewed. Denies recent fevers, chills, SOB, abd pain, diarrhea, dysuria.   Past Medical History:  Past Medical History  Diagnosis Date  . Diabetes mellitus   . Hyperlipemia   . Hypertension   . Pancreatitis     due to gall stone  . Arthritis   . Hyperlipidemia     Past Surgical History:  Past Surgical History  Procedure Laterality Date  . Right leg fracture  2003    repair with rod-  [facility]  . Cholecystectomy    . Hardware removal  08/29/2011    Procedure: HARDWARE REMOVAL;  Surgeon: Budd Palmer, MD;  Location: [facility] OR;  Service: Orthopedics;  Laterality: Right;  HARDWARE REMOVAL RIGHT HIP    Family History:  Family History  Problem Relation Age of Onset  . Diabetes      Social History:  reports that he quit smoking about 28 years ago. His smoking use included Cigarettes. He started smoking about 74 years ago. He smoked 1.00 pack per day. He has never used smokeless tobacco. He reports that he does not drink alcohol or use illicit drugs.  Allergies: No Known Allergies  Medications:   Medication List    ASK your doctor about these medications       diphenoxylate-atropine 2.5-0.025 MG per tablet  Commonly known as:  LOMOTIL  Take 1-2 tablets by mouth 4 (four) times daily as needed for diarrhea or loose stools.     glimepiride 4 MG tablet  Commonly known as:  AMARYL  Take 4 mg by mouth daily before breakfast.     lisinopril-hydrochlorothiazide 20-25 MG per tablet  Commonly known as:  PRINZIDE,ZESTORETIC  Take 2 tablets by mouth every morning.     lovastatin 20 MG tablet  Commonly known as:  MEVACOR   Take 20 mg by mouth every evening.     metFORMIN 1000 MG tablet  Commonly known as:  GLUCOPHAGE  Take 1,000 mg by mouth 2 (two) times daily with a meal.     naproxen sodium 220 MG tablet  Commonly known as:  ANAPROX  Take 220 mg by mouth 2 (two) times daily with a meal.     NEXAVAR 200 MG tablet  Generic drug:  SORAfenib  Take 400 mg by mouth 2 (two) times daily. Give on an empty stomach 1 hour before or 2 hours after meals.     traMADol 50 MG tablet  Commonly known as:  ULTRAM  Take 50-100 mg by mouth every 6 (six) hours as needed for pain.        Please HPI for pertinent positives, otherwise complete 10 system ROS negative.  Physical Exam: BP 132/72  Pulse 93  Temp(Src) 97.5 F (36.4 C) (Oral)  Resp 16  Ht 5\' 8"  (1.727 m)  Wt 150 lb (68.04 kg)  BMI 22.81 kg/m2  SpO2 92% Body mass index is 22.81 kg/(m^2).   General Appearance:  Alert, cooperative, no distress, appears stated age  Head:  Normocephalic, without obvious abnormality, atraumatic  ENT: Unremarkable  Neck: Supple, symmetrical, trachea midline  Lungs:   Clear to auscultation bilaterally, no w/r/r, respirations unlabored without use of accessory muscles.  Chest Wall:  No tenderness or deformity  Heart:  Regular rate and rhythm, S1, S2 normal, no murmur, rub or gallop.  Abdomen:   Soft, non-tender, non distended. (L)inguinal hernia  Extremities: Extremities normal, atraumatic, no cyanosis or edema  Pulses: 2+ and symmetric femoral  Neurologic: Normal affect, no gross deficits.   Results for orders placed during the hospital encounter of 09/24/12 (from the past 48 hour(s))  APTT     Status: None   Collection Time    09/24/12  7:50 AM      Result Value Range   aPTT 31  24 - 37 seconds  CBC     Status: None   Collection Time    09/24/12  7:50 AM      Result Value Range   WBC 5.3  4.0 - 10.5 K/uL   RBC 5.05  4.22 - 5.81 MIL/uL   Hemoglobin 14.6  13.0 - 17.0 g/dL   HCT 14.7  82.9 - 56.2 %   MCV 89.1   78.0 - 100.0 fL   MCH 28.9  26.0 - 34.0 pg   MCHC 32.4  30.0 - 36.0 g/dL   RDW 13.0  86.5 - 78.4 %   Platelets 321  150 - 400 K/uL  COMPREHENSIVE METABOLIC PANEL     Status: Abnormal (Preliminary result)   Collection Time    09/24/12  7:50 AM      Result Value Range   Sodium 135  135 - 145 mEq/L   Potassium 4.2  3.5 - 5.1 mEq/L   Chloride 99  96 - 112 mEq/L   CO2 27  19 - 32 mEq/L   Glucose, Bld 146 (*) 70 - 99 mg/dL   BUN 12  6 - 23 mg/dL   Creatinine, Ser 6.96  0.50 - 1.35 mg/dL   Calcium 8.3 (*) 8.4 - 10.5 mg/dL   Total Protein 5.8 (*) 6.0 - 8.3 g/dL   Albumin 2.8 (*) 3.5 - 5.2 g/dL   AST 295 (*) 0 - 37 U/L   ALT 230 (*) 0 - 53 U/L   Alkaline Phosphatase PENDING  39 - 117 U/L   Total Bilirubin 0.8  0.3 - 1.2 mg/dL   GFR calc non Af Amer 89 (*) >90 mL/min   GFR calc Af Amer >90  >90 mL/min   Comment: (NOTE)     The eGFR has been calculated using the CKD EPI equation.     This calculation has not been validated in all clinical situations.     eGFR's persistently <90 mL/min signify possible Chronic Kidney     Disease.  PROTIME-INR     Status: None   Collection Time    09/24/12  7:50 AM      Result Value Range   Prothrombin Time 12.8  11.6 - 15.2 seconds   INR 0.98  0.00 - 1.49   No results found.  Assessment/Plan Hepatocellular carcinoma For Visceral angiogram, possible coil embo, and administration of Y-90 test dose. Explained procedure, risks, complications, use of sedation. Labs reviewed, ok. Consent signed in chart  Brayton El PA-C 09/24/2012, 8:43 AM

## 2012-09-25 ENCOUNTER — Other Ambulatory Visit (HOSPITAL_COMMUNITY): Payer: Medicare Other

## 2012-10-03 ENCOUNTER — Other Ambulatory Visit: Payer: Self-pay | Admitting: Radiology

## 2012-10-04 ENCOUNTER — Encounter (HOSPITAL_COMMUNITY): Payer: Self-pay | Admitting: Pharmacy Technician

## 2012-10-08 ENCOUNTER — Ambulatory Visit (HOSPITAL_COMMUNITY)
Admission: RE | Admit: 2012-10-08 | Discharge: 2012-10-08 | Disposition: A | Discharge status: Home / Self Care | Payer: Medicare Other | Source: Ambulatory Visit | Attending: Interventional Radiology | Admitting: Interventional Radiology

## 2012-10-08 ENCOUNTER — Encounter (HOSPITAL_COMMUNITY)
Admission: RE | Admit: 2012-10-08 | Discharge: 2012-10-08 | Disposition: A | Discharge status: Home / Self Care | Payer: Medicare Other | Source: Ambulatory Visit | Attending: Interventional Radiology | Admitting: Interventional Radiology

## 2012-10-08 ENCOUNTER — Encounter (HOSPITAL_COMMUNITY): Payer: Self-pay

## 2012-10-08 ENCOUNTER — Other Ambulatory Visit (HOSPITAL_COMMUNITY): Payer: Self-pay | Admitting: Interventional Radiology

## 2012-10-08 DIAGNOSIS — E785 Hyperlipidemia, unspecified: Secondary | ICD-10-CM | POA: Insufficient documentation

## 2012-10-08 DIAGNOSIS — C220 Liver cell carcinoma: Secondary | ICD-10-CM

## 2012-10-08 DIAGNOSIS — D490 Neoplasm of unspecified behavior of digestive system: Secondary | ICD-10-CM

## 2012-10-08 DIAGNOSIS — C228 Malignant neoplasm of liver, primary, unspecified as to type: Secondary | ICD-10-CM | POA: Insufficient documentation

## 2012-10-08 DIAGNOSIS — E119 Type 2 diabetes mellitus without complications: Secondary | ICD-10-CM | POA: Insufficient documentation

## 2012-10-08 DIAGNOSIS — I10 Essential (primary) hypertension: Secondary | ICD-10-CM | POA: Insufficient documentation

## 2012-10-08 DIAGNOSIS — Z79899 Other long term (current) drug therapy: Secondary | ICD-10-CM | POA: Insufficient documentation

## 2012-10-08 DIAGNOSIS — K7460 Unspecified cirrhosis of liver: Secondary | ICD-10-CM | POA: Insufficient documentation

## 2012-10-08 LAB — COMPREHENSIVE METABOLIC PANEL
Alkaline Phosphatase: 1164 U/L — ABNORMAL HIGH (ref 39–117)
BUN: 17 mg/dL (ref 6–23)
Calcium: 7.9 mg/dL — ABNORMAL LOW (ref 8.4–10.5)
Creatinine, Ser: 0.79 mg/dL (ref 0.50–1.35)
GFR calc Af Amer: >90 mL/min (ref >90)
Glucose, Bld: 69 mg/dL — ABNORMAL LOW (ref 70–99)
Potassium: 3.1 mEq/L — ABNORMAL LOW (ref 3.5–5.1)
Total Protein: 4.9 g/dL — ABNORMAL LOW (ref 6.0–8.3)

## 2012-10-08 LAB — CBC
HCT: 41.2 % (ref 39.0–52.0)
Hemoglobin: 13.6 g/dL (ref 13.0–17.0)
MCH: 28.9 pg (ref 26.0–34.0)
MCHC: 33 g/dL (ref 30.0–36.0)
MCV: 87.7 fL (ref 78.0–100.0)

## 2012-10-08 LAB — GLUCOSE, CAPILLARY
Glucose-Capillary: 62 mg/dL — ABNORMAL LOW (ref 70–99)
Glucose-Capillary: 64 mg/dL — ABNORMAL LOW (ref 70–99)

## 2012-10-08 MED ORDER — MIDAZOLAM HCL 2 MG/2ML IJ SOLN
INTRAMUSCULAR | Status: AC
  Filled 2012-10-08: qty 6

## 2012-10-08 MED ORDER — DEXTROSE 5 % IV SOLN
INTRAVENOUS | Status: DC
  Administered 2012-10-08: 07:00:00 via INTRAVENOUS

## 2012-10-08 MED ORDER — PANTOPRAZOLE SODIUM 40 MG IV SOLR
40.0000 mg | Freq: Once | INTRAVENOUS | Status: AC
  Administered 2012-10-08: 40 mg via INTRAVENOUS
  Filled 2012-10-08: qty 40

## 2012-10-08 MED ORDER — DEXAMETHASONE SODIUM PHOSPHATE 10 MG/ML IJ SOLN
20.0000 mg | Freq: Once | INTRAMUSCULAR | Status: AC
  Administered 2012-10-08: 20 mg via INTRAVENOUS
  Filled 2012-10-08: qty 2

## 2012-10-08 MED ORDER — FENTANYL CITRATE 0.05 MG/ML IJ SOLN
INTRAMUSCULAR | Status: AC
  Filled 2012-10-08: qty 6

## 2012-10-08 MED ORDER — YTTRIUM 90 INJECTION: 38.2000 | INJECTION | Freq: Once | INTRAVENOUS | Status: DC

## 2012-10-08 MED ORDER — SODIUM CHLORIDE 0.9 % IV SOLN
INTRAVENOUS | Status: DC
  Administered 2012-10-08: 07:00:00 via INTRAVENOUS

## 2012-10-08 MED ORDER — HYDROCODONE-ACETAMINOPHEN 5-325 MG PO TABS: 1.0000 | ORAL_TABLET | ORAL | Status: DC | PRN

## 2012-10-08 MED ORDER — LIDOCAINE HCL 1 % IJ SOLN
INTRAMUSCULAR | Status: AC
  Filled 2012-10-08: qty 20

## 2012-10-08 MED ORDER — IOHEXOL 300 MG/ML  SOLN
100.0000 mL | Freq: Once | INTRAMUSCULAR | Status: AC | PRN
  Administered 2012-10-08: 90 mL

## 2012-10-08 MED ORDER — FENTANYL CITRATE 0.05 MG/ML IJ SOLN
INTRAMUSCULAR | Status: DC | PRN
  Administered 2012-10-08 (×2): 50 ug via INTRAVENOUS

## 2012-10-08 MED ORDER — PIPERACILLIN-TAZOBACTAM 3.375 G IVPB
3.3750 g | Freq: Once | INTRAVENOUS | Status: AC
  Administered 2012-10-08: 3.375 g via INTRAVENOUS
  Filled 2012-10-08: qty 50

## 2012-10-08 MED ORDER — MIDAZOLAM HCL 2 MG/2ML IJ SOLN
INTRAMUSCULAR | Status: DC | PRN
  Administered 2012-10-08 (×2): 1 mg via INTRAVENOUS

## 2012-10-08 MED ORDER — ONDANSETRON HCL 4 MG/2ML IJ SOLN
4.0000 mg | Freq: Once | INTRAMUSCULAR | Status: AC
  Administered 2012-10-08: 4 mg via INTRAVENOUS
  Filled 2012-10-08: qty 2

## 2012-10-08 NOTE — H&P (Signed)
Chief Complaint: "I'm here for liver treatment" Referring Physician:Dr. Gilman Buttner HPI: Jerome Sweeney is an 79 y.o. male with hepatocellular carcinoma. He has seen Dr. Deanne Coffer in consult and then underwent Pre Y-90 angiogram with coil embo of the GDA about 2 weeks ago.  He is now scheduled today for Y-90 radioembolization. He feels ok this am. He denies any trouble from his recent Pre Y-90 procedure. PMHx and meds reviewed. Denies recent fevers, chills, SOB, abd pain, diarrhea, dysuria.   Past Medical History:  Past Medical History  Diagnosis Date  . Diabetes mellitus   . Hyperlipemia   . Hypertension   . Pancreatitis     due to gall stone  . Arthritis   . Hyperlipidemia     Past Surgical History:  Past Surgical History  Procedure Laterality Date  . Right leg fracture  2003    repair with rod-  [facility]  . Cholecystectomy    . Hardware removal  08/29/2011    Procedure: HARDWARE REMOVAL;  Surgeon: Budd Palmer, MD;  Location: [facility] OR;  Service: Orthopedics;  Laterality: Right;  HARDWARE REMOVAL RIGHT HIP    Family History:  Family History  Problem Relation Age of Onset  . Diabetes      Social History:  reports that he quit smoking about 28 years ago. His smoking use included Cigarettes. He started smoking about 74 years ago. He smoked 1.00 pack per day. He has never used smokeless tobacco. He reports that he does not drink alcohol or use illicit drugs.  Allergies: No Known Allergies  Medications:   Medication List    ASK your doctor about these medications       diphenoxylate-atropine 2.5-0.025 MG per tablet  Commonly known as:  LOMOTIL  Take 1-2 tablets by mouth 4 (four) times daily as needed for diarrhea or loose stools.     glimepiride 4 MG tablet  Commonly known as:  AMARYL  Take 4 mg by mouth daily before breakfast.     lisinopril-hydrochlorothiazide 20-25 MG per tablet  Commonly known as:  PRINZIDE,ZESTORETIC  Take 2 tablets by mouth every morning.     lovastatin 20 MG tablet  Commonly known as:  MEVACOR  Take 20 mg by mouth every evening.     metFORMIN 1000 MG tablet  Commonly known as:  GLUCOPHAGE  Take 500 mg by mouth 2 (two) times daily with a meal.     naproxen sodium 220 MG tablet  Commonly known as:  ANAPROX  Take 220 mg by mouth 2 (two) times daily with a meal.     NEXAVAR 200 MG tablet  Generic drug:  SORAfenib  Take 400 mg by mouth 2 (two) times daily. Give on an empty stomach 1 hour before or 2 hours after meals.     OPTIVE 0.5-0.9 % Soln  Generic drug:  Carboxymethylcellul-Glycerin  Apply 1 drop to eye 2 (two) times daily as needed (dry eyes).     traMADol 50 MG tablet  Commonly known as:  ULTRAM  Take 50-100 mg by mouth every 6 (six) hours as needed for pain.     triamcinolone cream 0.1 %  Commonly known as:  KENALOG  Apply 1 application topically 2 (two) times daily as needed (rash).        Please HPI for pertinent positives, otherwise complete 10 system ROS negative.  Physical Exam: There were no vitals taken for this visit. There is no weight on file to calculate BMI.   General Appearance:  Alert, cooperative, no distress, appears stated age  Head:  Normocephalic, without obvious abnormality, atraumatic  ENT: Unremarkable  Neck: Supple, symmetrical, trachea midline  Lungs:   Clear to auscultation bilaterally, no w/r/r, respirations unlabored without use of accessory muscles.  Chest Wall:  No tenderness or deformity  Heart:  Regular rate and rhythm, S1, S2 normal, no murmur, rub or gallop.  Abdomen:   Soft, non-tender, non distended. (L)inguinal hernia  Extremities: Extremities normal, atraumatic, no cyanosis or edema  Pulses: 2+ and symmetric femoral  Neurologic: Normal affect, no gross deficits.   Results for orders placed during the hospital encounter of 10/08/12 (from the past 48 hour(s))  CBC     Status: None   Collection Time    10/08/12  7:10 AM      Result Value Range   WBC 7.1  4.0 -  10.5 K/uL   RBC 4.70  4.22 - 5.81 MIL/uL   Hemoglobin 13.6  13.0 - 17.0 g/dL   HCT 16.1  09.6 - 04.5 %   MCV 87.7  78.0 - 100.0 fL   MCH 28.9  26.0 - 34.0 pg   MCHC 33.0  30.0 - 36.0 g/dL   RDW 40.9  81.1 - 91.4 %   Platelets 217  150 - 400 K/uL  COMPREHENSIVE METABOLIC PANEL     Status: Abnormal   Collection Time    10/08/12  7:10 AM      Result Value Range   Sodium 135  135 - 145 mEq/L   Potassium 3.1 (*) 3.5 - 5.1 mEq/L   Chloride 99  96 - 112 mEq/L   CO2 27  19 - 32 mEq/L   Glucose, Bld 69 (*) 70 - 99 mg/dL   BUN 17  6 - 23 mg/dL   Creatinine, Ser 7.82  0.50 - 1.35 mg/dL   Calcium 7.9 (*) 8.4 - 10.5 mg/dL   Total Protein 4.9 (*) 6.0 - 8.3 g/dL   Albumin 2.3 (*) 3.5 - 5.2 g/dL   AST 956 (*) 0 - 37 U/L   ALT 183 (*) 0 - 53 U/L   Alkaline Phosphatase 1164 (*) 39 - 117 U/L   Total Bilirubin 0.9  0.3 - 1.2 mg/dL   GFR calc non Af Amer 81 (*) >90 mL/min   GFR calc Af Amer >90  >90 mL/min   Comment: (NOTE)     The eGFR has been calculated using the CKD EPI equation.     This calculation has not been validated in all clinical situations.     eGFR's persistently <90 mL/min signify possible Chronic Kidney     Disease.   No results found.  Assessment/Plan Hepatocellular carcinoma For repeat visceral angio and administration of Y-90 radioembolization of lesion. Explained procedure, risks, complications, use of sedation. Labs reviewed, ok. Consent signed in chart  Brayton El PA-C 10/08/2012, 8:10 AM

## 2012-10-08 NOTE — ED Notes (Signed)
Pt transported to Nuclear Medicine for port Y90 imaging on bed with RN and monitor.

## 2012-10-08 NOTE — ED Notes (Signed)
R groin with Gauze/tegaderm bandage CDI.

## 2012-10-08 NOTE — Procedures (Signed)
R hepatic art Y90 radioembolization No complication No blood loss. See complete dictation in [facility].

## 2012-10-08 NOTE — ED Notes (Signed)
5Fr sheath removed from R fem artery post Y90 treatment by Dr. Deanne Coffer.  Hemostasis achieved using manual pressure.  R groin level 0, RDP + doppler.

## 2012-10-08 NOTE — Progress Notes (Signed)
CBG 62. Pt asymptomatic. Brayton El made aware. 250cc D5W begun

## 2012-10-08 NOTE — ED Notes (Signed)
Transported to Short Stay for recovery.

## 2012-10-14 ENCOUNTER — Other Ambulatory Visit (HOSPITAL_COMMUNITY): Payer: Self-pay | Admitting: Interventional Radiology

## 2012-10-14 DIAGNOSIS — C228 Malignant neoplasm of liver, primary, unspecified as to type: Secondary | ICD-10-CM

## 2012-11-13 ENCOUNTER — Other Ambulatory Visit: Payer: Medicare Other

## 2012-11-25 ENCOUNTER — Telehealth: Payer: Self-pay | Admitting: Emergency Medicine

## 2012-11-25 NOTE — Telephone Encounter (Signed)
Wife called to say that pt. Is now on Hospice.  All appts. Cancelled.

## 2012-11-26 ENCOUNTER — Other Ambulatory Visit: Payer: Medicare Other

## 2012-12-23 DEATH — deceased

## 2015-02-18 IMAGING — NM NM SPECIAL TREATMENT PROCEDURE
5 series · 25 of 25 positions shown · non-contrast
Comparison: CTA 08/07/2012, MAA scan 09/24/2012.

RADIOPHARMACEUTICALS:  38.2 mCi [AGE] microspheres.

CLINICAL DATA: Right hepatoma.

EXAM:
NUCLEAR MEDICINE RADIO PHARM THERAPY INTRA ARTERIAL; NUCLEAR
MEDICINE TREATMENT PROCEDURE; NUCLEAR MEDICINE SPECIAL MED RAD
PHYSICS CONS
TECHNIQUE: In conjunction with the interventional radiologist a Y-90
Microsphere dose was calculated utilizing body surface area
formulation. Calculated dose equal 36.9 mCi. Pre therapy MAA liver
SPECT scan and CTA were evaluated. Utilizing a microcathetersystem,
the right hepatic artery was selected and Y-90 microspheres were
delivered in fractionated aliquots. Radiopharmaceutical was
delivered by the interventional radiologist and nuclear radiologist.
The patient tolerated procedure well. No adverse effects were noted.
Bremsstrahlung scan to follow.

[Series 1: mc (age) micro · 2.38mm/px · 1 of 1 slices shown (1 of 5)]
[im 1/1]
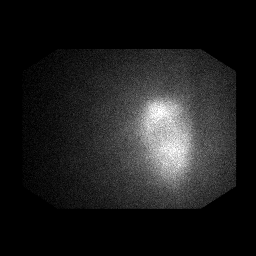

[Series 1: mc (age) micro · 4.75mm/px · 6 of 64 frames shown (2 of 5)]
[frame 6/64]
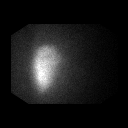
[frame 16/64]
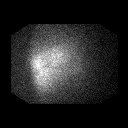
[frame 27/64]
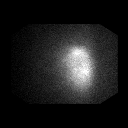
[frame 38/64]
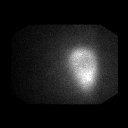
[frame 48/64]
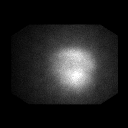
[frame 59/64]
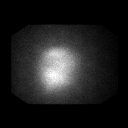

[Series 1: mc (age) micro · 4.7mm · 4.75mm/px · 6 of 91 frames shown (3 of 5)]
[frame 8/91]
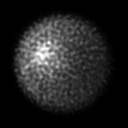
[frame 23/91]
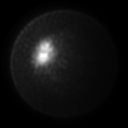
[frame 38/91]
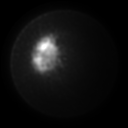
[frame 53/91]
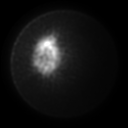
[frame 68/91]
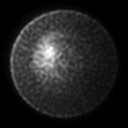
[frame 84/91]
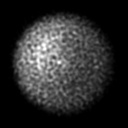

[Series 1: mc (age) micro · 4.7mm · 4.75mm/px · 6 of 91 frames shown (4 of 5)]
[frame 8/91]
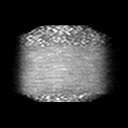
[frame 23/91]
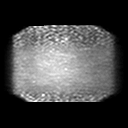
[frame 38/91]
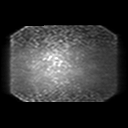
[frame 53/91]
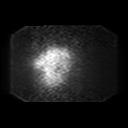
[frame 68/91]
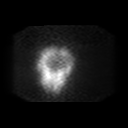
[frame 84/91]
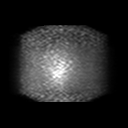

[Series 1: mc (age) micro · 4.7mm · 4.75mm/px · 6 of 91 frames shown (5 of 5)]
[frame 8/91]
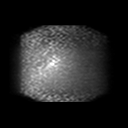
[frame 23/91]
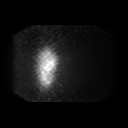
[frame 38/91]
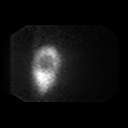
[frame 53/91]
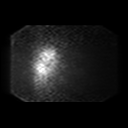
[frame 68/91]
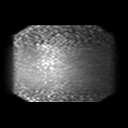
[frame 84/91]
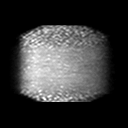

[25 of 25 positions shown; findings below may reference images not displayed]

FINDINGS: [AGE] macro spheres were administered per TECHNIQUE for treatment of
right hepatic lobe hepatoma.
IMPRESSION: Successful [AGE] microsphere radioembolization of the right hepatic
lobe.
